# Patient Record
Sex: Female | Born: 1970 | Hispanic: No | Marital: Single | Smoking: Current every day smoker
Health system: Southern US, Community
[De-identification: ages and names within clinical notes are randomized; demographics above are authoritative.]

## PROBLEM LIST (undated history)

## (undated) DIAGNOSIS — J449 Chronic obstructive pulmonary disease, unspecified: Secondary | ICD-10-CM

## (undated) DIAGNOSIS — M1990 Unspecified osteoarthritis, unspecified site: Secondary | ICD-10-CM

---

## 1997-11-20 ENCOUNTER — Emergency Department (HOSPITAL_COMMUNITY): Admission: EM | Admit: 1997-11-20 | Discharge: 1997-11-20 | Payer: Self-pay | Admitting: Emergency Medicine

## 1997-12-30 ENCOUNTER — Emergency Department (HOSPITAL_COMMUNITY): Admission: EM | Admit: 1997-12-30 | Discharge: 1997-12-30 | Payer: Self-pay | Admitting: Emergency Medicine

## 1998-01-23 ENCOUNTER — Emergency Department (HOSPITAL_COMMUNITY): Admission: EM | Admit: 1998-01-23 | Discharge: 1998-01-23 | Payer: Self-pay | Admitting: Internal Medicine

## 1998-02-04 ENCOUNTER — Emergency Department (HOSPITAL_COMMUNITY): Admission: EM | Admit: 1998-02-04 | Discharge: 1998-02-04 | Payer: Self-pay | Admitting: Emergency Medicine

## 1998-03-03 ENCOUNTER — Ambulatory Visit (HOSPITAL_COMMUNITY): Admission: RE | Admit: 1998-03-03 | Discharge: 1998-03-03 | Payer: Self-pay

## 1998-06-18 ENCOUNTER — Inpatient Hospital Stay (HOSPITAL_COMMUNITY): Admission: AD | Admit: 1998-06-18 | Discharge: 1998-06-18 | Payer: Self-pay | Admitting: Obstetrics & Gynecology

## 1998-11-15 ENCOUNTER — Emergency Department (HOSPITAL_COMMUNITY): Admission: EM | Admit: 1998-11-15 | Discharge: 1998-11-15 | Payer: Self-pay | Admitting: Emergency Medicine

## 1998-11-27 ENCOUNTER — Inpatient Hospital Stay (HOSPITAL_COMMUNITY): Admission: AD | Admit: 1998-11-27 | Discharge: 1998-11-27 | Payer: Self-pay | Admitting: Obstetrics

## 1998-11-27 ENCOUNTER — Encounter: Payer: Self-pay | Admitting: Obstetrics

## 1999-03-03 ENCOUNTER — Emergency Department (HOSPITAL_COMMUNITY): Admission: EM | Admit: 1999-03-03 | Discharge: 1999-03-03 | Payer: Self-pay

## 1999-08-25 ENCOUNTER — Emergency Department (HOSPITAL_COMMUNITY): Admission: EM | Admit: 1999-08-25 | Discharge: 1999-08-25 | Payer: Self-pay | Admitting: Emergency Medicine

## 2018-10-27 ENCOUNTER — Other Ambulatory Visit: Payer: Self-pay

## 2018-10-27 ENCOUNTER — Encounter (HOSPITAL_COMMUNITY): Payer: Self-pay

## 2018-10-27 ENCOUNTER — Emergency Department (HOSPITAL_COMMUNITY)
Admission: EM | Admit: 2018-10-27 | Discharge: 2018-10-27 | Disposition: A | Discharge status: Home / Self Care | Payer: BLUE CROSS/BLUE SHIELD | Attending: Emergency Medicine | Admitting: Emergency Medicine

## 2018-10-27 ENCOUNTER — Emergency Department (HOSPITAL_COMMUNITY): Payer: BLUE CROSS/BLUE SHIELD

## 2018-10-27 DIAGNOSIS — J449 Chronic obstructive pulmonary disease, unspecified: Secondary | ICD-10-CM | POA: Diagnosis not present

## 2018-10-27 DIAGNOSIS — R030 Elevated blood-pressure reading, without diagnosis of hypertension: Secondary | ICD-10-CM | POA: Insufficient documentation

## 2018-10-27 DIAGNOSIS — F17210 Nicotine dependence, cigarettes, uncomplicated: Secondary | ICD-10-CM | POA: Insufficient documentation

## 2018-10-27 DIAGNOSIS — R51 Headache: Secondary | ICD-10-CM | POA: Diagnosis present

## 2018-10-27 DIAGNOSIS — M542 Cervicalgia: Secondary | ICD-10-CM | POA: Diagnosis not present

## 2018-10-27 HISTORY — DX: Unspecified osteoarthritis, unspecified site: M19.90

## 2018-10-27 HISTORY — DX: Chronic obstructive pulmonary disease, unspecified: J44.9

## 2018-10-27 MED ORDER — LIDOCAINE 5 % EX PTCH: 1.0000 | MEDICATED_PATCH | CUTANEOUS | 0 refills | Status: AC

## 2018-10-27 MED ORDER — METHOCARBAMOL 500 MG PO TABS: 500.0000 mg | ORAL_TABLET | Freq: Two times a day (BID) | ORAL | 0 refills | Status: AC

## 2018-10-27 MED ORDER — LIDOCAINE 5 % EX PTCH
1.0000 | MEDICATED_PATCH | CUTANEOUS | Status: DC
  Administered 2018-10-27: 1 via TRANSDERMAL
  Filled 2018-10-27: qty 1

## 2018-10-27 MED ORDER — ACETAMINOPHEN 325 MG PO TABS
650.0000 mg | ORAL_TABLET | Freq: Once | ORAL | Status: AC
  Administered 2018-10-27: 17:00:00 650 mg via ORAL
  Filled 2018-10-27: qty 2

## 2018-10-27 NOTE — ED Notes (Signed)
Pt has been in contact with her husband multiple times.

## 2018-10-27 NOTE — ED Notes (Signed)
Patient verbalizes understanding of discharge instructions. Opportunity for questioning and answers were provided. Armband removed by staff, pt discharged from ED.  
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
## 2018-10-27 NOTE — ED Notes (Signed)
Patient transported to CT 
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
